# Patient Record
Sex: Female | Born: 1945 | Race: White | Hispanic: No
Health system: Southern US, Community
[De-identification: ages and names within clinical notes are randomized; demographics above are authoritative.]

---

## 2009-07-21 ENCOUNTER — Emergency Department: Payer: Self-pay | Admitting: Emergency Medicine

## 2010-03-27 ENCOUNTER — Ambulatory Visit: Payer: Self-pay | Admitting: Vascular Surgery

## 2011-04-01 ENCOUNTER — Ambulatory Visit: Payer: Self-pay | Admitting: Vascular Surgery

## 2012-07-07 ENCOUNTER — Ambulatory Visit: Payer: Self-pay | Admitting: Oncology

## 2012-07-24 ENCOUNTER — Inpatient Hospital Stay: Payer: Self-pay | Admitting: Internal Medicine

## 2012-07-24 LAB — CBC
MCH: 27.6 pg (ref 26.0–34.0)
Platelet: 306 10*3/uL (ref 150–440)
RBC: 4.56 10*6/uL (ref 3.80–5.20)
RDW: 15.4 % — ABNORMAL HIGH (ref 11.5–14.5)
WBC: 8.2 10*3/uL (ref 3.6–11.0)

## 2012-07-24 LAB — URINALYSIS, COMPLETE
Glucose,UR: NEGATIVE mg/dL (ref 0–75)
Hyaline Cast: 23
Nitrite: NEGATIVE
Protein: 100
Squamous Epithelial: 3
WBC UR: 32 /HPF (ref 0–5)

## 2012-07-24 LAB — CK TOTAL AND CKMB (NOT AT ARMC): CK, Total: 27 U/L (ref 21–215)

## 2012-07-24 LAB — COMPREHENSIVE METABOLIC PANEL
Albumin: 3.1 g/dL — ABNORMAL LOW (ref 3.4–5.0)
Anion Gap: 17 — ABNORMAL HIGH (ref 7–16)
Chloride: 99 mmol/L (ref 98–107)
Creatinine: 1.1 mg/dL (ref 0.60–1.30)
EGFR (African American): 60 — ABNORMAL LOW
SGPT (ALT): 10 U/L — ABNORMAL LOW (ref 12–78)
Sodium: 141 mmol/L (ref 136–145)
Total Protein: 7.8 g/dL (ref 6.4–8.2)

## 2012-07-24 LAB — PROTIME-INR: Prothrombin Time: 14.1 secs (ref 11.5–14.7)

## 2012-07-25 ENCOUNTER — Ambulatory Visit: Payer: Self-pay | Admitting: Oncology

## 2012-07-25 LAB — BASIC METABOLIC PANEL
BUN: 19 mg/dL — ABNORMAL HIGH (ref 7–18)
Chloride: 107 mmol/L (ref 98–107)
Co2: 27 mmol/L (ref 21–32)
EGFR (African American): >60
EGFR (Non-African Amer.): >60
Osmolality: 288 (ref 275–301)
Potassium: 4 mmol/L (ref 3.5–5.1)

## 2012-07-25 LAB — CBC WITH DIFFERENTIAL/PLATELET
Eosinophil #: 0 10*3/uL (ref 0.0–0.7)
HCT: 35.2 % (ref 35.0–47.0)
HGB: 11.3 g/dL — ABNORMAL LOW (ref 12.0–16.0)
Lymphocyte %: 11.6 %
MCH: 27.2 pg (ref 26.0–34.0)
MCHC: 32.2 g/dL (ref 32.0–36.0)
MCV: 85 fL (ref 80–100)
Monocyte #: 0.6 x10 3/mm (ref 0.2–0.9)
Neutrophil %: 76.9 %
Platelet: 234 10*3/uL (ref 150–440)
RDW: 15.2 % — ABNORMAL HIGH (ref 11.5–14.5)
WBC: 6.1 10*3/uL (ref 3.6–11.0)

## 2012-07-26 LAB — BODY FLUID CELL COUNT WITH DIFFERENTIAL
Basophil: 0 %
Eosinophil: 0 %
Lymphocytes: 45 %
Neutrophils: 40 %
Nucleated Cell Count: 2768 /mm3

## 2012-07-26 LAB — CEA: CEA: 0.4 ng/mL (ref 0.0–4.7)

## 2012-07-26 LAB — URINE CULTURE

## 2012-07-26 LAB — APTT: Activated PTT: 28.6 secs (ref 23.6–35.9)

## 2012-07-26 LAB — PROTEIN, BODY FLUID: Protein, Body Fluid: 6.1 g/dL

## 2012-07-26 LAB — GLUCOSE, SEROUS FLUID: Glucose, Body Fluid: 38 mg/dL

## 2012-07-26 LAB — CA 125: CA 125: 1035 U/mL — ABNORMAL HIGH (ref 0.0–34.0)

## 2012-07-26 LAB — AMYLASE, BODY FLUID: Amylase, Body Fluid: 165 U/L

## 2012-07-27 ENCOUNTER — Ambulatory Visit: Payer: Self-pay | Admitting: Oncology

## 2012-08-05 ENCOUNTER — Ambulatory Visit: Payer: Self-pay | Admitting: Gynecologic Oncology

## 2012-08-05 LAB — BASIC METABOLIC PANEL
Anion Gap: 9 (ref 7–16)
BUN: 18 mg/dL (ref 7–18)
Calcium, Total: 9 mg/dL (ref 8.5–10.1)
Chloride: 98 mmol/L (ref 98–107)
Co2: 27 mmol/L (ref 21–32)
EGFR (Non-African Amer.): 48 — ABNORMAL LOW
Osmolality: 270 (ref 275–301)
Sodium: 134 mmol/L — ABNORMAL LOW (ref 136–145)

## 2012-08-05 LAB — PROTIME-INR: Prothrombin Time: 12.9 secs (ref 11.5–14.7)

## 2012-08-05 LAB — APTT: Activated PTT: 32 secs (ref 23.6–35.9)

## 2012-08-05 LAB — CBC: WBC: 8.3 10*3/uL (ref 3.6–11.0)

## 2012-08-07 ENCOUNTER — Ambulatory Visit: Payer: Self-pay | Admitting: Oncology

## 2012-08-10 ENCOUNTER — Inpatient Hospital Stay: Payer: Self-pay | Admitting: Oncology

## 2012-08-11 LAB — CBC WITH DIFFERENTIAL/PLATELET
Basophil #: 0 10*3/uL (ref 0.0–0.1)
Basophil %: 0.7 %
Eosinophil %: 0.8 %
HCT: 25.6 % — ABNORMAL LOW (ref 35.0–47.0)
HGB: 8.4 g/dL — ABNORMAL LOW (ref 12.0–16.0)
Lymphocyte #: 0.7 10*3/uL — ABNORMAL LOW (ref 1.0–3.6)
Lymphocyte %: 11.4 %
MCHC: 32.9 g/dL (ref 32.0–36.0)
MCV: 85 fL (ref 80–100)
Monocyte #: 0.5 x10 3/mm (ref 0.2–0.9)
Monocyte %: 7.7 %
Neutrophil #: 5.1 10*3/uL (ref 1.4–6.5)
Neutrophil %: 79.4 %
RBC: 3.02 10*6/uL — ABNORMAL LOW (ref 3.80–5.20)
WBC: 6.4 10*3/uL (ref 3.6–11.0)

## 2012-08-11 LAB — BASIC METABOLIC PANEL
Calcium, Total: 8.2 mg/dL — ABNORMAL LOW (ref 8.5–10.1)
Chloride: 103 mmol/L (ref 98–107)
Co2: 29 mmol/L (ref 21–32)
Creatinine: 0.71 mg/dL (ref 0.60–1.30)
EGFR (African American): >60
EGFR (Non-African Amer.): >60
Osmolality: 275 (ref 275–301)
Potassium: 3.4 mmol/L — ABNORMAL LOW (ref 3.5–5.1)

## 2012-08-25 LAB — CBC CANCER CENTER
Basophil %: 1.2 %
Eosinophil #: 0.1 x10 3/mm (ref 0.0–0.7)
Eosinophil %: 1.6 %
HCT: 30.3 % — ABNORMAL LOW (ref 35.0–47.0)
Lymphocyte #: 1.1 x10 3/mm (ref 1.0–3.6)
Lymphocyte %: 19.4 %
MCH: 26.8 pg (ref 26.0–34.0)
MCHC: 31.7 g/dL — ABNORMAL LOW (ref 32.0–36.0)
Monocyte %: 9 %
Neutrophil %: 68.8 %
Platelet: 463 x10 3/mm — ABNORMAL HIGH (ref 150–440)
RBC: 3.58 10*6/uL — ABNORMAL LOW (ref 3.80–5.20)
RDW: 16.5 % — ABNORMAL HIGH (ref 11.5–14.5)
WBC: 5.7 x10 3/mm (ref 3.6–11.0)

## 2012-08-25 LAB — COMPREHENSIVE METABOLIC PANEL
Albumin: 1.8 g/dL — ABNORMAL LOW (ref 3.4–5.0)
Alkaline Phosphatase: 148 U/L — ABNORMAL HIGH (ref 50–136)
Anion Gap: 10 (ref 7–16)
Bilirubin,Total: 0.4 mg/dL (ref 0.2–1.0)
Creatinine: 1.17 mg/dL (ref 0.60–1.30)
EGFR (Non-African Amer.): 48 — ABNORMAL LOW
Glucose: 109 mg/dL — ABNORMAL HIGH (ref 65–99)
Osmolality: 282 (ref 275–301)
SGOT(AST): 28 U/L (ref 15–37)
Sodium: 141 mmol/L (ref 136–145)
Total Protein: 5.3 g/dL — ABNORMAL LOW (ref 6.4–8.2)

## 2012-08-25 LAB — PROTIME-INR
INR: 2.2
Prothrombin Time: 24.9 secs — ABNORMAL HIGH (ref 11.5–14.7)

## 2012-09-01 LAB — PROTIME-INR
INR: 2.1
Prothrombin Time: 23.4 secs — ABNORMAL HIGH (ref 11.5–14.7)

## 2012-09-01 LAB — COMPREHENSIVE METABOLIC PANEL
Albumin: 2.1 g/dL — ABNORMAL LOW (ref 3.4–5.0)
Anion Gap: 7 (ref 7–16)
Bilirubin,Total: 0.7 mg/dL (ref 0.2–1.0)
Calcium, Total: 8.2 mg/dL — ABNORMAL LOW (ref 8.5–10.1)
Chloride: 100 mmol/L (ref 98–107)
Co2: 32 mmol/L (ref 21–32)
Creatinine: 0.96 mg/dL (ref 0.60–1.30)
EGFR (African American): >60
EGFR (Non-African Amer.): >60
Osmolality: 278 (ref 275–301)
SGPT (ALT): 18 U/L (ref 12–78)
Sodium: 139 mmol/L (ref 136–145)
Total Protein: 5.8 g/dL — ABNORMAL LOW (ref 6.4–8.2)

## 2012-09-01 LAB — CBC CANCER CENTER
Basophil %: 1.6 %
Eosinophil %: 1.1 %
HCT: 28.8 % — ABNORMAL LOW (ref 35.0–47.0)
HGB: 9.3 g/dL — ABNORMAL LOW (ref 12.0–16.0)
Lymphocyte #: 0.4 x10 3/mm — ABNORMAL LOW (ref 1.0–3.6)
Lymphocyte %: 25.4 %
MCH: 27 pg (ref 26.0–34.0)
MCHC: 32.3 g/dL (ref 32.0–36.0)
Monocyte #: 0.1 x10 3/mm — ABNORMAL LOW (ref 0.2–0.9)
Monocyte %: 6.3 %
Neutrophil #: 1 x10 3/mm — ABNORMAL LOW (ref 1.4–6.5)
Neutrophil %: 65.6 %
Platelet: 357 x10 3/mm (ref 150–440)
RBC: 3.44 10*6/uL — ABNORMAL LOW (ref 3.80–5.20)
RDW: 16.3 % — ABNORMAL HIGH (ref 11.5–14.5)
WBC: 1.5 x10 3/mm — CL (ref 3.6–11.0)

## 2012-09-04 ENCOUNTER — Ambulatory Visit: Payer: Self-pay | Admitting: Oncology

## 2012-09-08 LAB — CBC CANCER CENTER
Eosinophil %: 0.4 %
HCT: 31.2 % — ABNORMAL LOW (ref 35.0–47.0)
Lymphocyte #: 0.9 x10 3/mm — ABNORMAL LOW (ref 1.0–3.6)
Lymphocyte %: 19.2 %
MCH: 27.5 pg (ref 26.0–34.0)
MCHC: 32.9 g/dL (ref 32.0–36.0)
MCV: 84 fL (ref 80–100)
Monocyte #: 0.4 x10 3/mm (ref 0.2–0.9)
Neutrophil #: 3.5 x10 3/mm (ref 1.4–6.5)
Neutrophil %: 71.3 %
RDW: 17.1 % — ABNORMAL HIGH (ref 11.5–14.5)
WBC: 4.9 x10 3/mm (ref 3.6–11.0)

## 2012-09-08 LAB — BASIC METABOLIC PANEL
Calcium, Total: 8.4 mg/dL — ABNORMAL LOW (ref 8.5–10.1)
Co2: 29 mmol/L (ref 21–32)
Creatinine: 1 mg/dL (ref 0.60–1.30)
Glucose: 76 mg/dL (ref 65–99)
Potassium: 3.4 mmol/L — ABNORMAL LOW (ref 3.5–5.1)
Sodium: 138 mmol/L (ref 136–145)

## 2012-09-08 LAB — HEPATIC FUNCTION PANEL A (ARMC)
Albumin: 2.1 g/dL — ABNORMAL LOW (ref 3.4–5.0)
Bilirubin, Direct: 0.4 mg/dL — ABNORMAL HIGH (ref 0.00–0.20)
Bilirubin,Total: 0.5 mg/dL (ref 0.2–1.0)
SGOT(AST): 41 U/L — ABNORMAL HIGH (ref 15–37)
SGPT (ALT): 22 U/L (ref 12–78)
Total Protein: 6 g/dL — ABNORMAL LOW (ref 6.4–8.2)

## 2012-09-08 LAB — PROTIME-INR: INR: 3.4

## 2012-09-15 LAB — COMPREHENSIVE METABOLIC PANEL
Albumin: 1.8 g/dL — ABNORMAL LOW (ref 3.4–5.0)
Alkaline Phosphatase: 131 U/L (ref 50–136)
Bilirubin,Total: 0.6 mg/dL (ref 0.2–1.0)
Calcium, Total: 7.7 mg/dL — ABNORMAL LOW (ref 8.5–10.1)
Chloride: 101 mmol/L (ref 98–107)
EGFR (African American): 58 — ABNORMAL LOW
Glucose: 125 mg/dL — ABNORMAL HIGH (ref 65–99)
Potassium: 2.7 mmol/L — ABNORMAL LOW (ref 3.5–5.1)
Total Protein: 5.3 g/dL — ABNORMAL LOW (ref 6.4–8.2)

## 2012-09-15 LAB — CBC CANCER CENTER
HCT: 27 % — ABNORMAL LOW (ref 35.0–47.0)
Lymphocyte #: 0.9 x10 3/mm — ABNORMAL LOW (ref 1.0–3.6)
MCHC: 33.2 g/dL (ref 32.0–36.0)
MCV: 84 fL (ref 80–100)
Monocyte #: 0.5 x10 3/mm (ref 0.2–0.9)
Monocyte %: 8 %
Neutrophil %: 74.6 %
Platelet: 400 x10 3/mm (ref 150–440)
WBC: 6 x10 3/mm (ref 3.6–11.0)

## 2012-09-15 LAB — PROTIME-INR: Prothrombin Time: 27.9 secs — ABNORMAL HIGH (ref 11.5–14.7)

## 2012-09-22 LAB — CBC CANCER CENTER
Basophil #: 0 x10 3/mm (ref 0.0–0.1)
Eosinophil %: 3 %
HCT: 22.9 % — ABNORMAL LOW (ref 35.0–47.0)
HGB: 7.5 g/dL — ABNORMAL LOW (ref 12.0–16.0)
Lymphocyte #: 0.7 x10 3/mm — ABNORMAL LOW (ref 1.0–3.6)
Lymphocyte %: 43.2 %
MCH: 27.7 pg (ref 26.0–34.0)
MCHC: 32.8 g/dL (ref 32.0–36.0)
MCV: 84 fL (ref 80–100)
Monocyte #: 0.1 x10 3/mm — ABNORMAL LOW (ref 0.2–0.9)
Monocyte %: 4.1 %
Neutrophil #: 0.8 x10 3/mm — ABNORMAL LOW (ref 1.4–6.5)
Neutrophil %: 49.1 %
Platelet: 261 x10 3/mm (ref 150–440)
RBC: 2.71 10*6/uL — ABNORMAL LOW (ref 3.80–5.20)
RDW: 19.7 % — ABNORMAL HIGH (ref 11.5–14.5)
WBC: 1.6 x10 3/mm — CL (ref 3.6–11.0)

## 2012-09-22 LAB — PROTIME-INR
INR: 3.7
Prothrombin Time: 35.3 secs — ABNORMAL HIGH (ref 11.5–14.7)

## 2012-09-29 LAB — PROTIME-INR: Prothrombin Time: 43.1 secs — ABNORMAL HIGH (ref 11.5–14.7)

## 2012-10-05 ENCOUNTER — Ambulatory Visit: Payer: Self-pay | Admitting: Oncology

## 2012-10-06 LAB — CBC CANCER CENTER
Basophil #: 0.1 x10 3/mm (ref 0.0–0.1)
Basophil %: 1 %
Eosinophil #: 0 x10 3/mm (ref 0.0–0.7)
Eosinophil %: 0.6 %
HCT: 25.7 % — ABNORMAL LOW (ref 35.0–47.0)
HGB: 8.3 g/dL — ABNORMAL LOW (ref 12.0–16.0)
MCH: 28.2 pg (ref 26.0–34.0)
MCHC: 32.3 g/dL (ref 32.0–36.0)
MCV: 87 fL (ref 80–100)
Monocyte %: 7 %
RBC: 2.95 10*6/uL — ABNORMAL LOW (ref 3.80–5.20)
RDW: 23.6 % — ABNORMAL HIGH (ref 11.5–14.5)
WBC: 5.7 x10 3/mm (ref 3.6–11.0)

## 2012-10-06 LAB — COMPREHENSIVE METABOLIC PANEL
Albumin: 1.6 g/dL — ABNORMAL LOW (ref 3.4–5.0)
Alkaline Phosphatase: 211 U/L — ABNORMAL HIGH (ref 50–136)
BUN: 11 mg/dL (ref 7–18)
Calcium, Total: 7.5 mg/dL — ABNORMAL LOW (ref 8.5–10.1)
Co2: 27 mmol/L (ref 21–32)
Creatinine: 1.21 mg/dL (ref 0.60–1.30)
EGFR (African American): 53 — ABNORMAL LOW
Osmolality: 277 (ref 275–301)
SGOT(AST): 38 U/L — ABNORMAL HIGH (ref 15–37)
Total Protein: 5.1 g/dL — ABNORMAL LOW (ref 6.4–8.2)

## 2012-10-06 LAB — PROTIME-INR: INR: 3.5

## 2012-10-07 LAB — CA 125: CA 125: 1546 U/mL — ABNORMAL HIGH (ref 0.0–34.0)

## 2012-10-11 LAB — CBC CANCER CENTER
Bands: 6 %
Eosinophil: 3 %
HCT: 28.9 % — ABNORMAL LOW (ref 35.0–47.0)
HGB: 9.5 g/dL — ABNORMAL LOW (ref 12.0–16.0)
MCH: 28.1 pg (ref 26.0–34.0)
MCHC: 33 g/dL (ref 32.0–36.0)
MCV: 85 fL (ref 80–100)
Metamyelocyte: 1 %
Monocytes: 5 %
Myelocyte: 1 %
Platelet: 276 x10 3/mm (ref 150–440)
RBC: 3.39 10*6/uL — ABNORMAL LOW (ref 3.80–5.20)
RDW: 23.5 % — ABNORMAL HIGH (ref 11.5–14.5)
Segmented Neutrophils: 38 %
WBC: 0.9 x10 3/mm — CL (ref 3.6–11.0)

## 2012-10-11 LAB — PROTIME-INR
INR: 2.4
Prothrombin Time: 25.5 secs — ABNORMAL HIGH (ref 11.5–14.7)

## 2012-10-12 ENCOUNTER — Inpatient Hospital Stay: Payer: Self-pay | Admitting: Internal Medicine

## 2012-10-12 LAB — CBC WITH DIFFERENTIAL/PLATELET
HCT: 30.8 % — ABNORMAL LOW (ref 35.0–47.0)
HGB: 9.9 g/dL — ABNORMAL LOW (ref 12.0–16.0)
Lymphocytes: 68 %
MCV: 88 fL (ref 80–100)
Monocytes: 4 %
Platelet: 216 10*3/uL (ref 150–440)
RDW: 23.4 % — ABNORMAL HIGH (ref 11.5–14.5)
Segmented Neutrophils: 28 %
WBC: 0.5 10*3/uL — CL (ref 3.6–11.0)

## 2012-10-12 LAB — CK: CK, Total: 32 U/L (ref 21–215)

## 2012-10-12 LAB — COMPREHENSIVE METABOLIC PANEL
Alkaline Phosphatase: 157 U/L — ABNORMAL HIGH (ref 50–136)
Anion Gap: 8 (ref 7–16)
Calcium, Total: 7.3 mg/dL — ABNORMAL LOW (ref 8.5–10.1)
Chloride: 104 mmol/L (ref 98–107)
Creatinine: 0.77 mg/dL (ref 0.60–1.30)
EGFR (Non-African Amer.): >60
Glucose: 84 mg/dL (ref 65–99)
Potassium: 4 mmol/L (ref 3.5–5.1)
SGOT(AST): 28 U/L (ref 15–37)
Total Protein: 5 g/dL — ABNORMAL LOW (ref 6.4–8.2)

## 2012-10-12 LAB — PROTIME-INR
INR: 2.3
Prothrombin Time: 24.5 secs — ABNORMAL HIGH (ref 11.5–14.7)

## 2012-10-12 LAB — URINALYSIS, COMPLETE
Bacteria: NONE SEEN
Bilirubin,UR: NEGATIVE
Blood: NEGATIVE
Glucose,UR: NEGATIVE mg/dL (ref 0–75)
Hyaline Cast: 4
Leukocyte Esterase: NEGATIVE
Nitrite: NEGATIVE
Specific Gravity: 1.027 (ref 1.003–1.030)
WBC UR: 2 /HPF (ref 0–5)

## 2012-10-12 LAB — TROPONIN I: Troponin-I: <0.02 ng/mL

## 2012-10-12 LAB — AMMONIA: Ammonia, Plasma: <25 mcmol/L (ref 11–32)

## 2012-10-13 LAB — CBC WITH DIFFERENTIAL/PLATELET
Basophil #: 0 10*3/uL (ref 0.0–0.1)
Basophil #: 0 10*3/uL (ref 0.0–0.1)
Eosinophil #: 0 10*3/uL (ref 0.0–0.7)
Eosinophil #: 0 10*3/uL (ref 0.0–0.7)
HCT: 22.4 % — ABNORMAL LOW (ref 35.0–47.0)
HGB: 7.6 g/dL — ABNORMAL LOW (ref 12.0–16.0)
HGB: 7.3 g/dL — ABNORMAL LOW (ref 12.0–16.0)
Lymphocyte #: 0.2 10*3/uL — ABNORMAL LOW (ref 1.0–3.6)
Lymphocyte #: 0.3 10*3/uL — ABNORMAL LOW (ref 1.0–3.6)
Lymphocyte %: 51.5 %
Lymphocyte %: 55.9 %
MCH: 28.6 pg (ref 26.0–34.0)
MCHC: 32.8 g/dL (ref 32.0–36.0)
MCV: 86 fL (ref 80–100)
Monocyte #: 0 x10 3/mm — ABNORMAL LOW (ref 0.2–0.9)
Monocyte %: 29.7 %
Neutrophil #: 0.1 10*3/uL — ABNORMAL LOW (ref 1.4–6.5)
Neutrophil %: 12.8 %
Platelet: 184 10*3/uL (ref 150–440)
RBC: 2.66 10*6/uL — ABNORMAL LOW (ref 3.80–5.20)
RDW: 22.6 % — ABNORMAL HIGH (ref 11.5–14.5)
WBC: 0.3 10*3/uL — CL (ref 3.6–11.0)
WBC: 0.5 10*3/uL — CL (ref 3.6–11.0)

## 2012-10-13 LAB — BASIC METABOLIC PANEL
Anion Gap: 9 (ref 7–16)
BUN: 13 mg/dL (ref 7–18)
Co2: 26 mmol/L (ref 21–32)
Creatinine: 0.64 mg/dL (ref 0.60–1.30)
EGFR (African American): >60
Sodium: 140 mmol/L (ref 136–145)

## 2012-10-14 LAB — CBC WITH DIFFERENTIAL/PLATELET
Basophil #: 0 10*3/uL (ref 0.0–0.1)
Eosinophil #: 0 10*3/uL (ref 0.0–0.7)
Eosinophil %: 3.6 %
HGB: 7.3 g/dL — ABNORMAL LOW (ref 12.0–16.0)
Lymphocyte #: 0.3 10*3/uL — ABNORMAL LOW (ref 1.0–3.6)
MCH: 28.6 pg (ref 26.0–34.0)
MCV: 87 fL (ref 80–100)
Monocyte %: 26.7 %
Neutrophil #: 0.1 10*3/uL — ABNORMAL LOW (ref 1.4–6.5)
Neutrophil %: 13.5 %
RBC: 2.54 10*6/uL — ABNORMAL LOW (ref 3.80–5.20)

## 2012-10-15 LAB — CBC WITH DIFFERENTIAL/PLATELET
Basophil #: 0 10*3/uL (ref 0.0–0.1)
Basophil %: 0.2 %
Eosinophil #: 0 10*3/uL (ref 0.0–0.7)
Eosinophil %: 1.3 %
HCT: 21.5 % — ABNORMAL LOW (ref 35.0–47.0)
HGB: 7.1 g/dL — ABNORMAL LOW (ref 12.0–16.0)
Lymphocyte #: 0.3 10*3/uL — ABNORMAL LOW (ref 1.0–3.6)
MCH: 28.3 pg (ref 26.0–34.0)
MCV: 85 fL (ref 80–100)
Monocyte %: 33 %
Neutrophil #: 0.4 10*3/uL — ABNORMAL LOW (ref 1.4–6.5)
Neutrophil %: 37.9 %
Platelet: 175 10*3/uL (ref 150–440)
RBC: 2.52 10*6/uL — ABNORMAL LOW (ref 3.80–5.20)
RDW: 22.4 % — ABNORMAL HIGH (ref 11.5–14.5)
WBC: 1.1 10*3/uL — CL (ref 3.6–11.0)

## 2012-10-15 LAB — BASIC METABOLIC PANEL
Anion Gap: 8 (ref 7–16)
BUN: 14 mg/dL (ref 7–18)
Calcium, Total: 6.6 mg/dL — CL (ref 8.5–10.1)
Co2: 25 mmol/L (ref 21–32)
Creatinine: 0.73 mg/dL (ref 0.60–1.30)
EGFR (African American): >60
EGFR (Non-African Amer.): >60
Glucose: 76 mg/dL (ref 65–99)
Osmolality: 277 (ref 275–301)
Potassium: 2.5 mmol/L — CL (ref 3.5–5.1)
Sodium: 139 mmol/L (ref 136–145)

## 2012-10-15 LAB — PROTIME-INR
INR: 3.3
Prothrombin Time: 32.2 secs — ABNORMAL HIGH (ref 11.5–14.7)

## 2012-10-16 LAB — CBC WITH DIFFERENTIAL/PLATELET
Bands: 22 %
Eosinophil: 3 %
HCT: 22.9 % — ABNORMAL LOW (ref 35.0–47.0)
Lymphocytes: 22 %
MCH: 28.2 pg (ref 26.0–34.0)
MCV: 87 fL (ref 80–100)
Monocytes: 2 %
Myelocyte: 1 %
NRBC/100 WBC: 4 /
Platelet: 178 10*3/uL (ref 150–440)
RDW: 22.8 % — ABNORMAL HIGH (ref 11.5–14.5)
WBC: 2.7 10*3/uL — ABNORMAL LOW (ref 3.6–11.0)

## 2012-10-16 LAB — PROTIME-INR
INR: 3.5
Prothrombin Time: 33.5 secs — ABNORMAL HIGH (ref 11.5–14.7)

## 2012-10-16 LAB — POTASSIUM: Potassium: 3.2 mmol/L — ABNORMAL LOW (ref 3.5–5.1)

## 2012-10-17 LAB — CBC WITH DIFFERENTIAL/PLATELET
Basophil #: 0 10*3/uL (ref 0.0–0.1)
Eosinophil %: 0.8 %
Lymphocyte %: 6.9 %
MCH: 28.4 pg (ref 26.0–34.0)
MCHC: 32.4 g/dL (ref 32.0–36.0)
MCV: 88 fL (ref 80–100)
Monocyte #: 0.4 x10 3/mm (ref 0.2–0.9)
Monocyte %: 6 %
Neutrophil %: 86.2 %
Platelet: 178 10*3/uL (ref 150–440)
RDW: 23.3 % — ABNORMAL HIGH (ref 11.5–14.5)

## 2012-10-19 LAB — CULTURE, BLOOD (SINGLE)

## 2012-11-04 ENCOUNTER — Ambulatory Visit: Payer: Self-pay | Admitting: Oncology

## 2012-11-04 DEATH — deceased

## 2013-12-15 IMAGING — CT CT HEAD WITHOUT CONTRAST
1 of 2 series · 13 of 30 positions shown, 17 images · non-contrast
Comparison: none

REASON FOR EXAM: confusion rising CA 125
COMMENTS:

PROCEDURE:     ACEVEDO ZAPATA - SARADA TART WITHOUT CONTRAST  - October 11, 2012 [DATE]
RESULT:     History: Confusion. Elevated tumor markers.
Comparison Study: No recent.

[Series 2: soft tissue · axial · 0.39mm/px · z∈[+3,+123]mm · 13 of 30 slices shown, 17 images]
[im 3/30  brain]
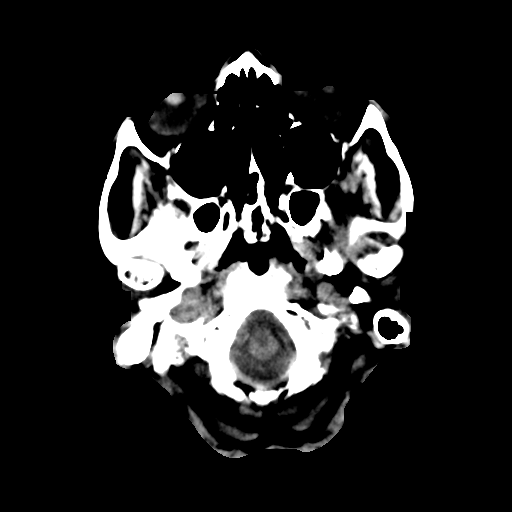
[im 3/30  bone]
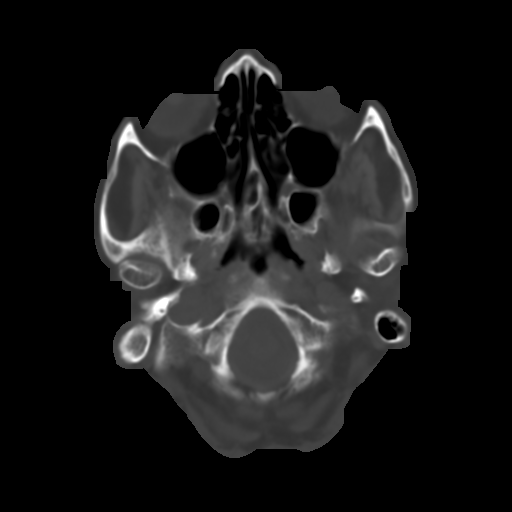
[im 5/30  brain]
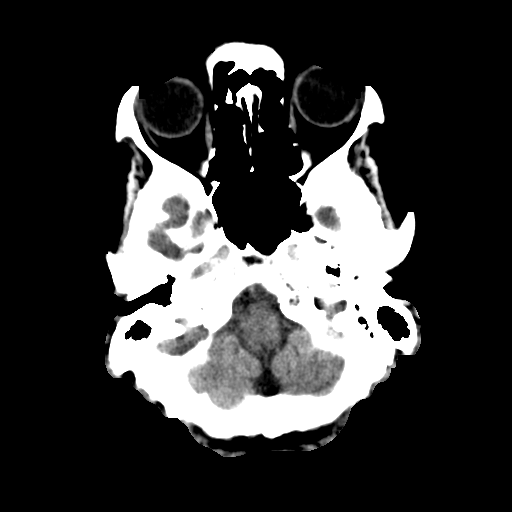
[im 7/30  brain]
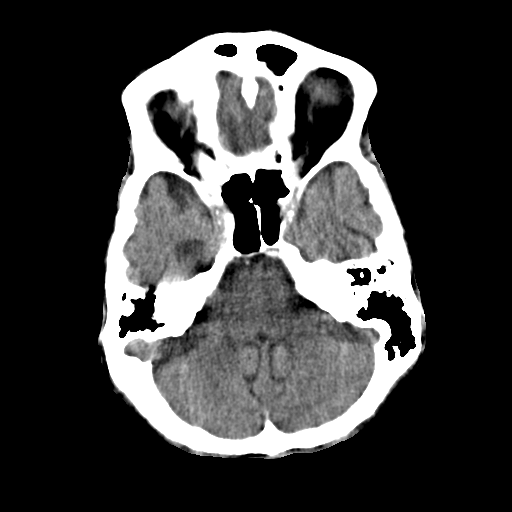
[im 9/30  brain]
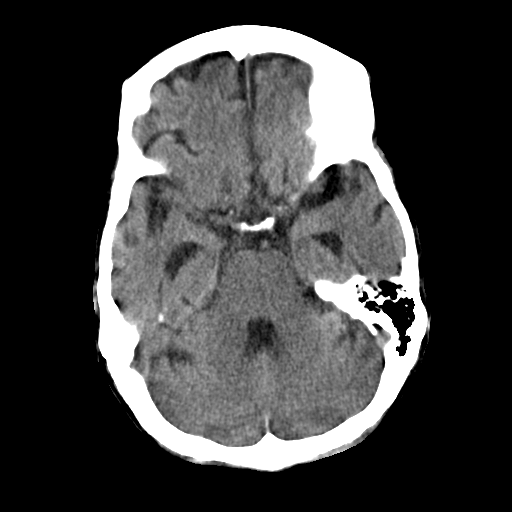
[im 11/30  brain]
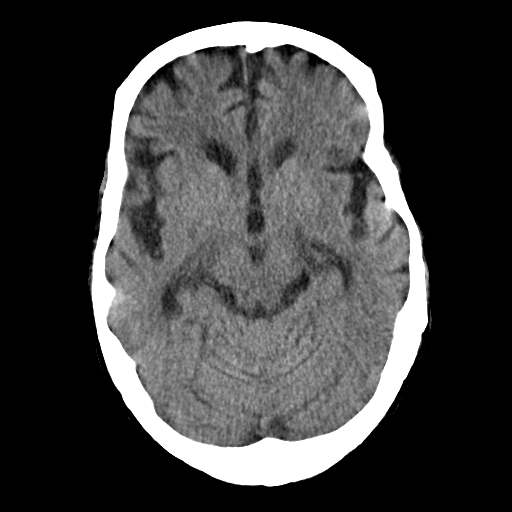
[im 11/30  bone]
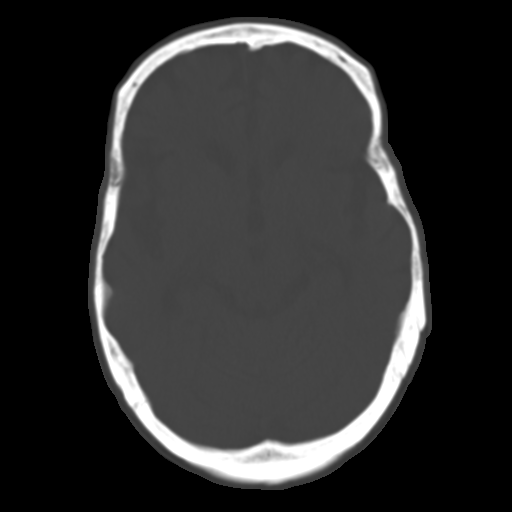
[im 13/30  brain]
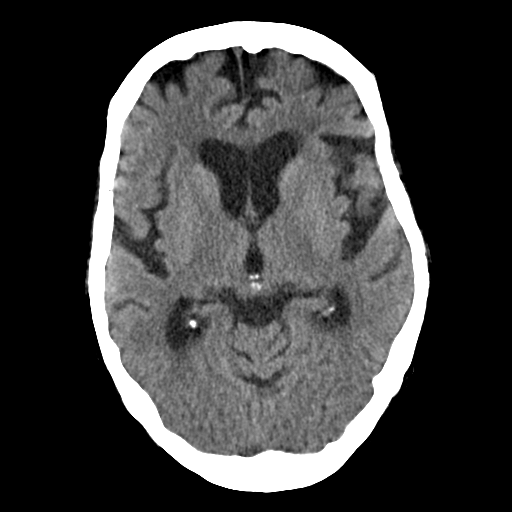
[im 15/30  brain]
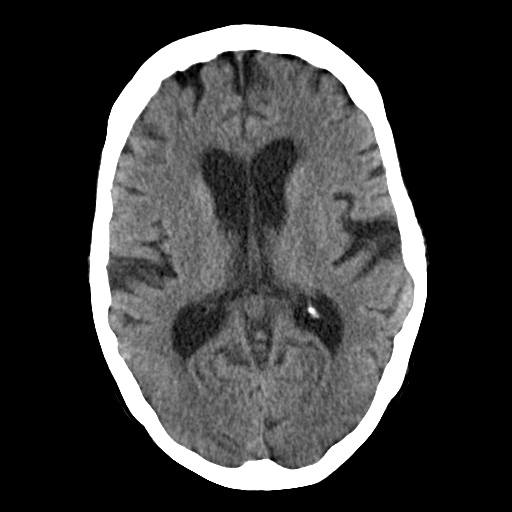
[im 17/30  brain]
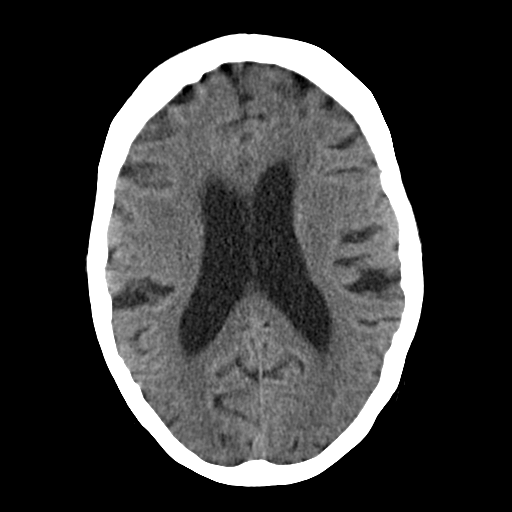
[im 19/30  brain]
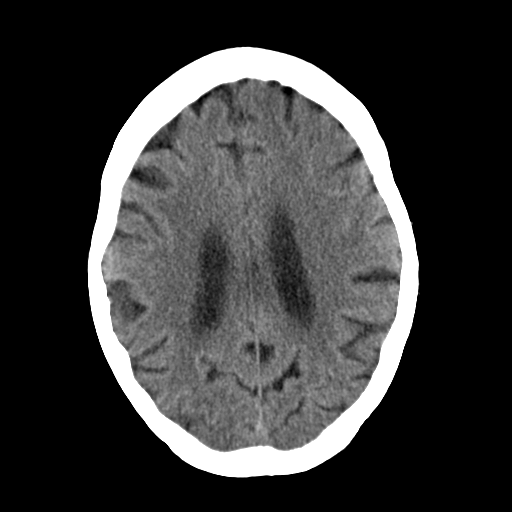
[im 19/30  bone]
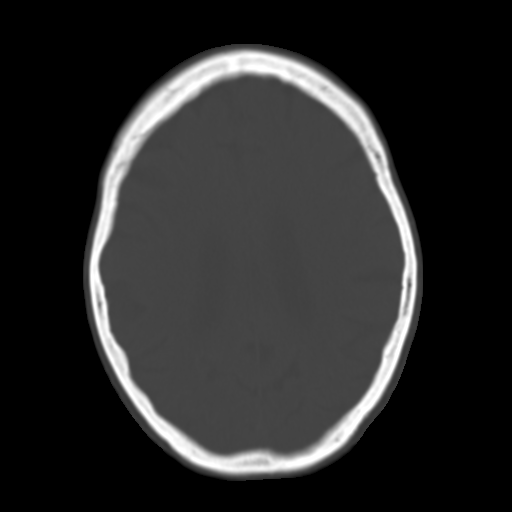
[im 21/30  brain]
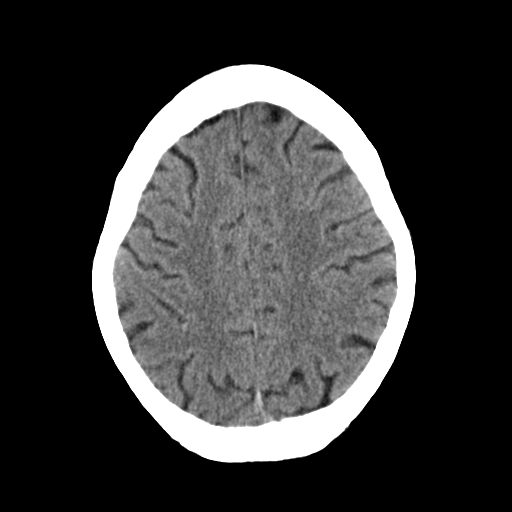
[im 23/30  brain]
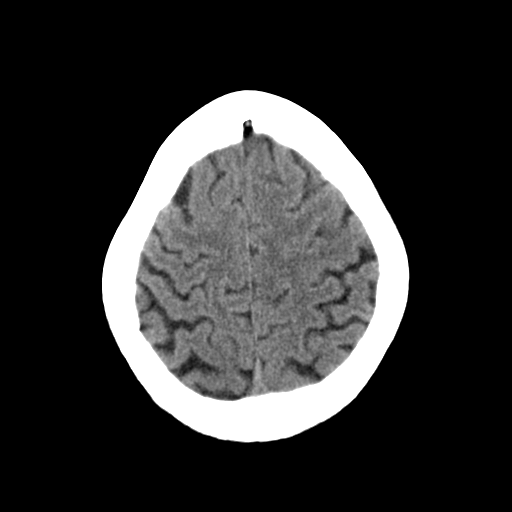
[im 25/30  brain]
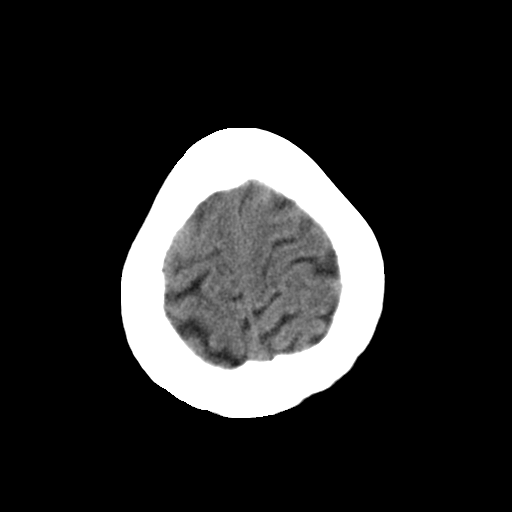
[im 27/30  brain]
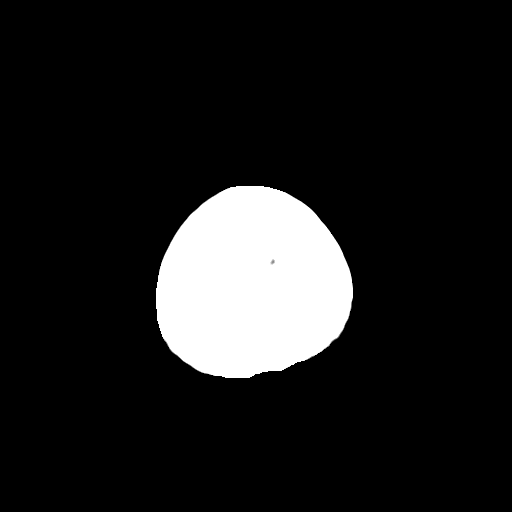
[im 27/30  bone]
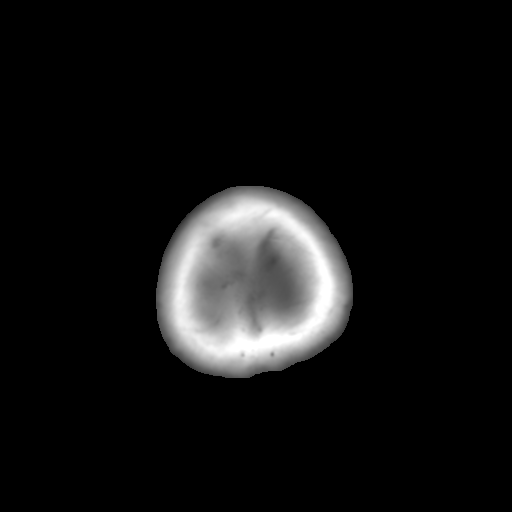

[13 of 30 positions shown; findings below may reference images not displayed]

FINDINGS: Standard nonenhanced CT obtained. No mass. No hydrocephalus. No
hemorrhage. No acute bony abnormality.
IMPRESSION: No acute abnormality.

## 2014-10-27 NOTE — Discharge Summary (Signed)
PATIENT NAME:  Tami [name], Tami [name] MR#:  [id number] DATE OF BIRTH:  1945/08/17  DATE OF ADMISSION:  07/26/2012 DATE OF DISCHARGE:    PRESENTING COMPLAINT: Abdominal distention and abdominal pain.   DISCHARGE DIAGNOSES: 1.  Peritoneal carcinomatosis, source unknown.  2.  Ascites, status post paracentesis with cytology pending.  3.  Elevate CA 125.  The patient to follow-up with Dr. Wendy Poet.  4.  Hypertension.  5.  Nausea and weight loss, suspected due to peritoneal carcinomatosis.  6.  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 7.  Urinary tract infection.   CODE STATUS: Full code.   DISCHARGE MEDICATIONS: 1.  Atorvastatin 20 mg p.o. at bedtime. 2.  Clonazepam 0.5 mg 3 times a day. 3.  Fluoxetine 20 mg p.o. daily.  4.  Meloxicam 15 mg p.o. daily.  5.  Amlodipine 2.5 mg daily.  6.  Ensure Plus one can daily.  7.  Cipro 250 b.i.d.  8.  Promethazine 25 mg 1 tablet daily every 6 hours as needed.   DIET: Regular.   FOLLOWUP:  1.  Follow up with Dr. Orlie Dakin on January 21st at 3:30 p.m.  2.  Follow up with [facility] in 1 to 2 weeks.  PROCEDURES: Ultrasound-guided paracentesis done on January 20th by Dr. Register, interventional radiology, with removal of 1000 mL of ascitic fluid.  Ascitic fluid protein is 6.1, albumin 3.3, glucose 38, amylase is 165, nucleated cell count 2768. Neutrophils 40%, lymphocytes 45%. Cytology pending. White count 6.1, H and H  11.3 and 35.2. Glucose 77.  The rest of the chemistries are within normal limits. PT-INR 14.1 and 1.1. CA 125 is 1035. CEA is 0.4. CT of the abdomen and pelvis with contrast shows moderate degree of infected. There is nodular thickening of the peritoneal reflexion as well as nodular thickening of the mesenteric fat of the upper abdomen which is concerning for peritoneal carcinomatosis. Descending thoracic aortic aneurysm as well as thoracic and abdominal aortic dissection was similar to prior CT compared with  September 2012. Aneurysmal dilatation of the partially visualized portion of the ascending aorta is similar to prior CT.  UA positive for urinary tract infection. Urine culture negative. Cardiac enzymes negative.   CONSULTATIONS:   1.  Dr. Wyn Quaker.   [facility].  [facility], Dr. Orlie Dakin.   BRIEF SUMMARY OF HOSPITAL COURSE:  The patient is a 69 year old Caucasian female who comes to the Emergency Room with:  1.  Abdominal pain, nausea and vomiting. She had presented with abdominal pain, nausea, vomiting, The patient's CT scan showed peritoneal carcinomatosis, source unclear at this time. Could be ovarian, given elevated CA 125. The patient, however, reports undergoing total abdominal hysterectomy; details not available. She was seen by Dr. Orlie Dakin who recommends the patient be seen by Dr. Wendy Poet at the [facility]. The patient has appointment for [facility] at 3:30 p.m.  The patient's granddaughter was explained about the above. 2.  Nausea.  Supportive treatment was given. She was encouraged on Ensure drinks and p.r.n. Phenergan was prescribed.  3.  Malnutrition with weight loss, suspected from possible underlying malignancy.  4.  Aortic dissection, chronic. The patient was seen by Dr. Wyn Quaker.  All home blood pressure medications were continued. Blood pressure medications were continued. The patient is aware of her CT findings.  She may need repair if the aneurysm continues to enlarge.  5.  Hyperlipidemia on Lipitor.   Overall hospital stay remained stable.   CODE STATUS: The  patient remained a full code.   TIME SPENT: 40 minutes.    ____________________________ Wylie HailSona A. Allena KatzPatel, MD sap:ct D: 07/27/2012 08:13:15 ET T: 07/27/2012 10:01:59 ET JOB#: [id number]  cc: Cali Cuartas A. Allena KatzPatel, MD, <Dictator> Tollie Pizzaimothy J. Orlie DakinFinnegan, MD Maxine GlennBrigitte E. Miller, MD [facility] Annice NeedyJason S. Dew, MD   Willow OraSONA A Zan Orlick MD ELECTRONICALLY SIGNED 07/30/2012 21:34

## 2014-10-27 NOTE — H&P (Signed)
PATIENT NAME:  Tami [name], Tami [name] MR#:  [id number] DATE OF BIRTH:  1946/06/14  DATE OF ADMISSION:  07/24/2012  PRIMARY CARE PHYSICIAN: Patient does not have one.   CHIEF COMPLAINT: Abdominal pain, nausea and vomiting.   HISTORY OF PRESENT ILLNESS: This is [name] who presents to the hospital with abdominal pain, nausea, vomiting ongoing now for the past month and [name] half. The patient says that she does not have primary care physician and did not see [name] doctor despite having these symptoms. The patient says she is able to swallow okay, but had developed significant nausea shortly after she eats.  She has had poor p.o. intake now for the past month and [name] half. She has also had significant weight loss, although she cannot tell me how many pounds she has lost.  As per the son at bedside, she has lost [name] significant amount of weight. The patient did not want to come to the hospital but her son convinced her and brought her to the Emergency Room. The patient underwent [name] CT scan of the abdomen and pelvis here in the Emergency Room which showed signs of ascites and findings consistent with peritoneal carcinomatosis. Hospitalist services were contacted for further treatment and evaluation.   REVIEW OF SYSTEMS:  CONSTITUTIONAL: No documented fever. Positive fatigue and weakness. Positive weight loss, although cannot tell me how much. No weight gain.  EYES: No blurry or double vision.  ENT: No tinnitus. No postnasal drip. No redness of the oropharynx.  RESPIRATORY: No cough, no wheeze, no hemoptysis, no dyspnea.  CARDIOVASCULAR: No chest pain, no orthopnea, no palpitations or syncope.  GASTROINTESTINAL: Positive nausea. Positive vomiting. Positive generalized abdominal pain. No melena, no hematochezia.  GENITOURINARY: No dysuria or hematuria.  ENDOCRINE: No polyuria or nocturia. No heat or cold intolerance.   HEMATOLOGIC:  No anemia, no bruising, no bleeding.  INTEGUMENTARY: No rashes or lesions.   MUSCULOSKELETAL: No arthritis, no swelling and no gout.  NEUROLOGIC: No numbness or tingling. No ataxia, no seizure activity.  PSYCHIATRIC: No anxiety, no insomnia, no ADD.   PAST MEDICAL HISTORY:  History of hypertension, history of coronary artery disease, history of tobacco abuse.   ALLERGIES: No known drug allergies.   SOCIAL HISTORY: Used to be [name] smoker, quit about 4-5 years ago. DID smoke about 20+ years. No alcohol abuse. No illicit drug abuse. Lives at home by herself.   FAMILY HISTORY: Father died from complications of lung cancer. Mother died from complications of heart disease.   CURRENT MEDICATIONS: She is currently on no medications although she is supposed to be taking medications for hypertension.   PHYSICAL EXAMINATION:  VITAL SIGNS:  Temperature 98.4, pulse 98, respirations 18, blood pressure 107/79, sats 98% on room air.  GENERAL: She is [name] pleasant-appearing female but very unkempt in no apparent distress.  HEENT: Atraumatic, normocephalic. Her extraocular muscles are intact. Pupils equal, round and reactive to light. Sclerae anicteric. No conjunctival injection. No oropharyngeal erythema. Poor dentition.  HEART: Regular rate and rhythm. No murmurs, rubs or clicks. Tachycardic.  LUNGS: Clear to auscultation bilaterally. No rales, no rhonchi, no wheezes. No dullness to percussion.  ABDOMEN: Soft, flat, distended. Hypoactive bowel sounds. No hepatosplenomegaly appreciated.  EXTREMITIES: No evidence of any cyanosis, clubbing or peripheral edema. Has +2 pedal and radial pulses bilaterally.  NEUROLOGIC: She is alert, awake and oriented x 3 with no focal motor or sensory deficits appreciated bilaterally.  SKIN: Moist and warm with no rash appreciated.  LYMPHATIC: There is  no cervical or axillary lymphadenopathy.   LABORATORY AND DIAGNOSTIC DATA:  Serum glucose 108, BUN 22, creatinine 1.1, sodium 141, potassium 3.4, chloride 99, bicarb 25. The patient's LFTs are within normal  limits. Albumin 3.1. Troponin less than 0.02. White cell count 8.2, hemoglobin 12.6, hematocrit 39, platelet count 386.  Urinalysis positive for UTI. CT scan of the abdomen and pelvis done with contrast showed moderate degree of ascites, nodular thickening of the peritoneal reflections as low as nodular thickening of the mesenteric fat of the upper abdomen which is concerning for peritoneal carcinomatosis, descending thoracic aortic aneurysm as well as [name] thoracic abdominal aortic dissection, which is similar to prior.   ASSESSMENT AND PLAN: This is [name] with [name] history of hypertension, coronary disease, history of tobacco abuse who has not seen [name] doctor in many years who presents to the hospital with [name] 58-month history of abdominal pain, nausea, vomiting, poor p.o. intake and weight loss. [name] CT scan of the abdomen and pelvis is suggestive of ascites with peritoneal carcinomatosis.   1.  Ascites with peritoneal carcinomatosis. The exact etiology of this is unclear, suspicious for malignancy, with the primary unknown. For now, I will continue supportive care with intravenous fluids, antiemetics and pain control.  She will likely need [name] diagnostic paracentesis which I have ordered.  I also ordered fluid studies for cytology, cell count, pH, glucose, LDH, albumin. I will also go ahead and check [name] CA125 and [name] CEA and we will also obtain oncology consult.   2.  Abdominal pain, nausea and vomiting. This is likely secondary to the CT scan findings as mentioned above. I will continue supportive care with intravenous fluids, antiemetics, pain control and place her on [name] clear liquid diet for now.  3.  Urinary tract infection.  Will start her on intravenous ciprofloxacin. She is clinically asymptomatic. Follow urine cultures.  4.  Aortic dissection/aneurysm. This was noted on the CT scan abdomen and pelvis as stated. The patient has been seen by Dr. Wyn Quaker from vascular surgery who does not think that there is  an acute intervention needed for this at this time; it is currently stable. The scan need to be repeated in 3 months or so.   CODE STATUS: The patient is [name] full code.   Time spent on admission: 50 minutes.   ____________________________ Rolly Pancake. Cherlynn Kaiser, MD vjs:ct D: 07/24/2012 21:58:19 ET T: 07/25/2012 12:08:37 ET JOB#: [id number]  cc: Rolly Pancake. Cherlynn Kaiser, MD, <Dictator> Houston Siren MD ELECTRONICALLY SIGNED 07/26/2012 14:14

## 2014-10-27 NOTE — Consult Note (Signed)
ONCOLOGY followup note - resting in bed, weak, lethargic. Denies pain. No fever.  No obvious bleeding issues. weak and lethargic, NAD          vitals - 98.3, 113, 18, 138/87, 93% on RA          lungs - b/l good breath sounds, no rhonchi          abd - distended, firm, generalised mild tenderness          extremities - edema + hemoglobin 7.1, WBC 1100, ANC 400, platelets 175, INR 3.3, Cr 0.73, K+ 692.295, Ca 336.446. year old female with known Peritoneal carcinomatosis s/p cycle 3 chemo on 10/06/12 with Taxol/Carboplatin, admitted with febrile neutropenia, AMS. MRI brain negative for metastasis or other acute process. Remains afebrile, ANC is beginning to improve and is upto 400 today.  AMS could be from metabolic encephalopathy, recommend continuing current treatment, Neupogen, correct electrolyte abnormalities and see if her condition improves. Overall prognosis seems poor. She is DNR. Continue to monitor daily CBC. Will continue to follow.   Electronic Signatures: Izola PricePandit, Feleica Fulmore Raj (MD)  (Signed on 11-Apr-14 22:32)  Authored  Last Updated: 11-Apr-14 22:32 by Izola PricePandit, Giovanne Nickolson Raj (MD)

## 2014-10-27 NOTE — Consult Note (Signed)
ONCOLOGY followup note - resting in bed, weak, lethargic and noncommunicative. Pain under fairly good control.   No fever.  No obvious bleeding issues. weak, lethargic, otherwise appears comfortable           vitals - 98.8, 120, 16, 117/72, 90% on RA          lungs - b/l good breath sounds, no rhonchi          abd - distended, firm, generalised mild tenderness          extremities - edema + hemoglobin 7.6, WBC 7400, ANC 6400, platelets 178K. year old female with known Peritoneal carcinomatosis s/p cycle 3 chemo on 10/06/12 with Taxol/Carboplatin, admitted with febrile neutropenia, AMS. MRI brain negative for metastasis or other acute process. Remains afebrile, ANC has normalised, stopped Neupogen yesterday. AMS and general condition is not improving and she is more lethargic. Ongoing declining condition could be from terminal condition with confusion/delirium versus metabolic encephalopathy or paraneoplastic syndrome. Continue MSO4 to 2-4 mg IV q 1-2 hours prn pain. She is DNR. Continue to monitor daily CBC.  Per discussion with patient's brother yesterday, they want to also discuss with Dr. Orlie DakinFinnegan who is her primary oncologist and will likely consider hospice if no further improvement.  Will notify Dr. Orlie DakinFinnegan.  Electronic Signatures: Tami [name], Tami [name] (MD)  (Signed on 13-Apr-14 20:12)  Authored  Last Updated: 13-Apr-14 20:12 by Tami [name], [name] (MD)

## 2014-10-27 NOTE — H&P (Signed)
PATIENT NAME:  Tami Tami [name], Tami Tami [name] MR#:  [id number] DATE OF BIRTH:  Jan 24, 1946  DATE OF ADMISSION:  10/12/2012  PRIMARY CARE PHYSICIAN: Tami Fractionimothy Finnegan, MD   CHIEF COMPLAINT: Brought in by family secondary to altered mental status.   HISTORY OF PRESENT ILLNESS: This is Tami [name] who has had altered mental status for Tami [name] few days now, getting worse every day.  She cannot get up on her own.  They physically have to pick her up. She is not eating much as she is refusing to eat and take her medications. She has been talking out of her head and paranoid.  The patient complains of severe abdominal pain, unable to give much history.   The patient is under the treatment of Dr. Orlie Tami [name] for peritoneal carcinomatosis, stage IIIB. She receives chemotherapy every 3 weeks.  In the ER, her white count was found to be 0.5. Family is unable to take care of her at home any more. Hospitalist services were contacted for further evaluation.   PAST MEDICAL HISTORY:  1. Peritoneal carcinomatosis, stage IIIB, undergoing chemotherapy every 3 weeks.  2. Aneurysm. 3. DVT. 4. Anemia.   PAST SURGICAL HISTORY:  1. Hysterectomy.  2. IVC filter. 3. Port-Tami [name]-Cath placement.   ALLERGIES: No known drug allergies.   MEDICATIONS: As per prescription writer include amlodipine 2.5 mg daily, atorvastatin 20 mg daily, Coumadin on hold from 04/03 to 04/06 but she does take 5 mg,  meloxicam 15 mg daily, Reglan 10 mg 4 times Tami [name] day, Zofran 8 mg 3 times Tami [name] day, potassium chloride 20 mEq twice Tami [name] day.   SOCIAL HISTORY: No smoking. No alcohol. No drug use. Lives with family.  FAMILY HISTORY: Father died of probably lung cancer. Mother died of heart failure and had diabetes.   REVIEW OF SYSTEMS:  CONSTITUTIONAL: Positive for cold feeling. No fever. No chills. Positive for weight loss. Positive for weakness.  EYES: She does wear glasses.  EARS, NOSE, MOUTH AND THROAT: No hearing loss. No sore throat. No difficulty swallowing.   CARDIOVASCULAR: No chest pain. No palpitations.  RESPIRATORY: No shortness of breath. No coughing.  GASTROINTESTINAL: Positive for nausea. Positive for vomiting.  Positive for abdominal pain. No diarrhea. No constipation. No bright red blood per rectum. No melena.  GENITOURINARY: No burning on urination, no hematuria.  MUSCULOSKELETAL: No joint pain or muscle pain.  INTEGUMENT: No rash or ulcers.  NEUROLOGICAL: No fainting or blackouts.  PSYCHIATRIC: The patient been talking out of her head.  ENDOCRINE: No thyroid problems.  HEMATOLOGIC/LYMPHATIC: History of anemia, also now leukopenia.   PHYSICAL EXAMINATION: VITAL SIGNS: Temperature 97.9, pulse 119, respirations 17, blood pressure 120/67, pulse ox 94% on room air.  GENERAL: No respiratory distress, lying flat in bed.  HEENT: Eyes: Conjunctivae normal. Lids normal. Pupils are equal, round, and reactive to light. Extraocular muscles are intact. No nystagmus. Ears, nose, mouth, and throat: Tympanic membranes no erythema. Nasal mucosa no erythema. Throat no erythema, no exudate seen. Lips and gums no lesions.  NECK: No JVD. No bruits. No lymphadenopathy. No thyromegaly. No thyroid nodules palpated.  RESPIRATORY: Lungs are clear to auscultation. No use of accessory muscles to breathe. No rhonchi, rales or wheeze heard.  CARDIOVASCULAR: S1 and S2, tachycardic. No gallops or rubs heard. Tami [name] 2/6 systolic ejection murmur. Carotid upstroke 2+ bilaterally. No bruits. Dorsalis pedis pulses 2+ bilaterally. No edema of the lower extremity.  ABDOMEN: Soft. Positive tenderness throughout the entire abdomen.  Guarding. Normoactive bowel sounds.   LYMPHATIC: No lymph  nodes in the neck.  MUSCULOSKELETAL: No clubbing, edema or cyanosis.  SKIN: Around the neck, erythematous area which is blanching.  NEUROLOGIC: Cranial nerves II through XII grossly intact. The patient does answer some yes and no questions appropriately, but the patient is Tami [name] poor historian.  As  per the family, she has been talking out of her head lately.  PSYCHIATRIC: The patient is oriented to person.   LABORATORY AND RADIOLOGICAL DATA:  Glucose 84, BUN 14, creatinine 0.77, sodium 137, potassium 4.0, chloride 104, CO2 25, calcium 7.3, alkaline phosphatase 157, ALT 14, AST 28, total protein 5.0. Troponin negative. White blood cell count of 0.5, H and H 9.9 and 30.8, platelet count of 216.  Urinalysis 1+ ketones, otherwise negative.   Chest x-ray: Widening of the descending thoracic aorta similar to prior. Cardiac silhouette enlarged. CT scan of the head from yesterday negative.   ASSESSMENT AND PLAN: 1. Encephalopathy: With the patient's history of cancer, could be numerous things. I will get an MRI of the brain to rule out metastases.  With her white count being low, could be infection. We will get blood cultures x 2 and empiric antibiotics, Maxipime and vancomycin.  MRI would also rule out stroke.  2. Leukopenia, most likely chemotherapy-related:  We will consult Dr. Orlie Dakin, and we will put on neutropenic precautions.  3. Peritoneal carcinomatosis:   Overall prognosis is poor. The patient is having severe abdominal pain now. CT scan of the abdomen and pelvis ordered for restaging.  I will get Tami [name] palliative care consultation. The patient is not eating. I will give p.r.n. pain medications, nausea medications, and IV Protonix.  4. Deep vein thrombosis: ER physician did not order an INR.  I am still waiting for that result to come back. Coumadin dosing will be based on that. The patient did have an IVC filter.  5. Erythema on the neck: I am wondering if this patient has dermatomyositis. I will send off Tami [name] CPK.  6. Hypertension: I will hold blood pressure medications at this time.  7. Hyperlipidemia: I will hold cholesterol medication at this time.   CODE STATUS:  The case discussed with Tami [name] nephew, who had to leave early, would like the patient to be Tami [name].    TIME SPENT ON ADMISSION: 55  minutes.  ____________________________ Tami Tami [name]. Tami Gloss, MD rjw:cb D: 10/12/2012 21:41:12 ET T: 10/12/2012 21:57:36 ET JOB#: [id number]  cc: Tami Tami [name]. Tami Gloss, MD, <Dictator> Tollie Pizza. Tami Dakin, MD Salley Scarlet MD ELECTRONICALLY SIGNED 10/16/2012 19:21

## 2014-10-27 NOTE — Discharge Summary (Signed)
PATIENT NAME:  Tami Tami [name], Tami Tami [name] MR#:  [id number] DATE OF BIRTH:  July 11, 1945  DATE OF ADMISSION:  10/12/2012 DATE OF DISCHARGE:  10/18/2012  PRIMARY CARE PHYSICIAN: Dr. Orlie DakinFinnegan.   DISCHARGE DIAGNOSES: 1.  Peritoneal carcinomatosis.  2.  Neutropenia.  3.  Pancytopenia.  4.  Acute encephalopathy.  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  6.  Chronic dissection of aorta.  7.  History of deep vein thrombosis.  8.  Hypokalemia, hypomagnesemia.  9.  Malnutrition.   CONSULTANTS: Dr. Harvie JuniorPhifer of palliative care, Dr. Orlie DakinFinnegan of oncology, Dr. Toni Amendlapacs of psychiatry.   IMAGING STUDIES DONE: Include Tami [name] CT scan of the abdomen and pelvis with contrast shows large amount of abdominal and pelvic fluid versus peritoneal carcinomatosis. Also Tami [name] chronic, stable aortic dissection in the abdominal aorta.   ADMITTING HISTORY AND PHYSICAL AND HOSPITAL COURSE: Please see detailed H and P dictated on 10/12/2012. In brief, this is Tami [name] patient with peritoneal carcinomatosis who was brought into the hospital with progressively worsening encephalopathy. The patient was admitted for further workup and treatment.   HOSPITAL COURSE:  Acute encephalopathy. This was thought to be likely secondary from her advanced cancer versus paraneoplastic syndrome. The patient was seen by Dr. Orlie DakinFinnegan of oncology, later by Dr. Sherrlyn HockPandit. The patient, with Tami [name] poor prognosis and no response to chemotherapy, was thought to be Tami [name] candidate for hospice service. The case was discussed with family by oncology and palliative care being involved. The family decided to make the patient comfort care and transition to hospice home. The patient did have neutropenia for which she had Neulasta, was covered with broad-spectrum antibiotics of cefepime and vancomycin. Later vancomycin was discontinued and cefepime continued. The patient's neutropenia had resolved during the hospital stay. CT of the abdomen showed peritoneal carcinomatosis  and Tami [name] chronic stable aortic dissection. Dr. Wyn Quakerew was involved in her care, who suggested just followup of the dissection, no acute intervention.   On the day of discharge, the patient still was confused with blood pressure 120/87 and was discharged to hospice home with poor prognosis.   DISCHARGE MEDICATIONS: 1.  Fentanyl patch 25 mcg topical every 3 days.  2.  Lorazepam 0.5 mg, 1 to 2 tablets sublingual every 2 to 4 hours as needed for agitation and anxiety.   3.  Morphine 20 mg/mL, 0.5 to 1 mL every 1 to 2 hours as needed for pain, dysphagia.  4.  Zofran ODT 4 mg oral every 6 hours as needed for nausea, vomiting.   DISCHARGE INSTRUCTIONS: Further management as per hospice home. The patient's family decided on comfort measures.   TIME SPENT: On day of discharge in discharge activity was 38 minutes.   ____________________________ Tami BailiffSrikar R. Antonae Zbikowski, MD srs:cs D: 10/19/2012 14:53:00 ET T: 10/19/2012 15:09:49 ET JOB#: [id number]  cc: Tami HeathSrikar R. Nika Yazzie, MD, <Dictator> Tami FishermanSRIKAR R Ivoree Felmlee MD ELECTRONICALLY SIGNED 10/21/2012 13:00

## 2014-10-27 NOTE — Consult Note (Signed)
PATIENT NAME:  Tami [name], Tami [name] MR#:  [id number] DATE OF BIRTH:  April 11, 1946  DATE OF CONSULTATION:  10/13/2012  REFERRING PHYSICIAN:   CONSULTING PHYSICIAN:  Knute Neu. Gittin, MD  HISTORY OF PRESENT ILLNESS:  The patient is a 69 year old patient who is primarily [facility].  The patient has peritoneal carcinomatosis and has received Taxol and carboplatin therapy.  The last cycle, day one cycle 3 was 1 week ago.  The patient was planned for restaging scanning and GYN oncology evaluation after the third cycle.    PAST MEDICAL HISTORY:  The patient has a past history that includes abdominal aortic aneurysm with dissection, DVT on Coumadin, anemia, prior hysterectomy, IVC filter placement, Port-A-Cath placement.   ALLERGIES:  No known allergies.   MEDICATIONS:  Have been amlodipine 2.5 mg daily, atorvastatin 20 mg daily, Coumadin 5 mg daily, meloxicam daily, Reglan 4 times daily, Zofran 8 mg 3 times daily as needed, potassium 20 mEq twice a day.   SOCIAL HISTORY:  No alcohol.  No smoking.   FAMILY HISTORY:  Noncontributory.  There is lung cancer in the family.   The patient was admitted April 8th with altered mental status, general weakness, the patient is not taking medications or eating.  There was no history of fever previously, but she did show a temperature during this hospitalization.  Also, severe abdominal pain and was admitted with these symptoms, evaluation particularly directed towards the abdominal pain, neutropenia, watching for occult infection and altered mental status.   REVIEW OF SYSTEMS:   Additional system review at this time:  The patient says she has abdominal pain.  Denied headache or other pain.  Denied short of breath or cough.  Admitted that she was weak.  Said she felt cold, was not having a chill, was not coughing.  Said she was not hungry.  No pruritus.  No vomiting or diarrhea.   PHYSICAL EXAMINATION: GENERAL:  Alert, answers  questions, but mentally sluggish.  No gross focal weakness, moving all extremities against gravity.  Following one-step commands.  HEENT:  Sclerae clear.  No thrush.  LYMPHATIC:  No palpable mass or nodes in the neck, supraclavicular, submandibular, axilla.  ABDOMEN:  Slightly tender, but no guarding and no rigidity.  HEART:  Non-regular.  LUNGS:  Clear.  EXTREMITIES:  No lower extremity edema.   LABORATORY DATA:  Creatinine on admission was 0.6 with a potassium was 3.3, the glucose was 87, calcium was 7.1.  Sodium was 140, white count was 300, hemoglobin 7.6, platelets were 184.   IMPRESSION:  The patient with abdominal pain, has been given morphine.  She is given vancomycin and cefepime apparently for neutropenia, altered mental status and then later she had a temperature of 100.3.  Temperature is down now.  MRI was planned, but not yet performed.  Abdominal scan was done that shows what looks like persistent findings with no real improvement compared to the pretreatment scan.   PLAN:  Agree with plans for MRI.  Agree with broad-spectrum empiric antibiotics, neutropenic fever.  I ordered a dose of Neupogen and we should reassess that every 24 hours depending on the neutrophil count.  Appreciated palliative care consult.  Oncology will follow up tomorrow.  Dr. 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  Would transfuse at current levels of hemoglobin less than 8 grams .  The patient appears to be symptomatic with weakness and tachycardia.  Use irradiated products when transfusing.  Appropriate actions depending on the MRI findings.     ____________________________ Knute Neuobert G. Lorre NickGittin, MD rgg:ea D: 10/13/2012 20:13:07 ET T: 10/13/2012 23:18:33  ET JOB#: [id number]  cc: Knute Neuobert G. Lorre NickGittin, MD, <Dictator> Marin RobertsOBERT G GITTIN MD ELECTRONICALLY SIGNED 10/28/2012 9:53

## 2014-10-27 NOTE — Op Note (Signed)
PATIENT NAME:  Tami [name], Tami [name] MR#:  [id number] DATE OF BIRTH:  08/28/1945  DATE OF PROCEDURE:  08/10/2012  PREOPERATIVE DIAGNOSIS: Peritoneal carcinomatosis.   POSTOPERATIVE DIAGNOSIS: Advanced peritoneal carcinomatosis.   PROCEDURE PERFORMED: Mini laparotomy with biopsy of an omental cake.   SURGEON: Maxine GlennBrigitte E. Ely Ballen, MD   ANESTHESIA: General.   ESTIMATED BLOOD LOSS: 25 mL.   COMPLICATIONS: None.   INDICATION FOR SURGERY: The patient is [name] 69 year old patient who presented with advanced peritoneal carcinomatosis and in view of this is not [name] candidate for tumor reductive surgery. However, [name] CT-directed biopsy was not possible; therefore, decision was made to proceed with mini laparotomy.   FINDINGS AT THE TIME OF SURGERY: Large omental cake with dense scarring to the stomach and the transverse colon. The small bowel mesentery is retracted due to tumor, and multiple nodular lesions are seen on large and small bowel and abdominal wall. In view of the mesenteric disease, tumor reductive surgery is not possible. Due to adhesion, it was not possible to completely evaluate the pelvis. Ascites, 2000 mL, was removed.   OPERATIVE REPORT: After adequate general anesthesia had been obtained, the patient was prepped and draped in supine position. [name] small incision was placed above the umbilicus. The fascia was transected. The peritoneum was identified and entered. The ascites was removed. Then, using cautery, biopsies were taken from the omental cake. Hemostasis was achieved with cautery and noted to be adequate.   The fascia was closed with [name] running #1 PDS suture. Adequate hemostasis in the subcutaneous tissue was obtained before it was reapproximated with 2-0 Vicryl, and the skin was closed with [name] 3-0 Monocryl suture in [name] subcuticular fashion.  The patient tolerated the procedure well and was taken to the recovery room in stable condition. Postoperative urine was clear. Pad, sponge, needle, and  instrument counts were correct x 2.   ____________________________ Maxine GlennBrigitte E. Demari Gales, MD bem:cb D: 08/11/2012 10:51:02 ET T: 08/11/2012 11:15:47 ET JOB#: [id number]  cc: Maxine GlennBrigitte E. Chanique Duca, MD, <Dictator> Maxine GlennBRIGITTE E Indonesia Mckeough MD ELECTRONICALLY SIGNED 08/17/2012 14:23

## 2014-10-27 NOTE — Consult Note (Signed)
Patient being admitted with abdominal pain, FTT.  CT scan shows severe ascites and likely carcinomatosis of unknown source.  Also has about a 5 cm thoracoabdominal AAA secondary to old dissection.  This has only grown about 5 mm in 2-3 years. This has a low risk of rupture on the range of about 1%/year.  Below threshold size for consideration for intervention, and given her likely late stage malignancy, repair would not be considered at this time.  Will see as an outpatient if desired.    Electronic Signatures: Annice Needyew, Tametra Ahart S (MD)  (Signed on 18-Jan-14 21:48)  Authored  Last Updated: 18-Jan-14 21:48 by Annice Needyew, Nature Kueker S (MD)

## 2014-10-27 NOTE — Consult Note (Signed)
Details:   - Psychiatry: Patient seen. Still confused and intermittantly agitated. Clearly in pain. Patient not able to give history - information from nurses on duty. I will change the risperdal to M-Tab (dissolvible) . Cont pain management and prn haldol and will check in tomorrow. Family should be aware that pt may not return to full lucidity given her advanced illness and should be made comfortable.   Electronic Signatures: Audery Amellapacs, John T (MD)  (Signed 12-Apr-14 11:18)  Authored: Details   Last Updated: 12-Apr-14 11:18 by Audery Amellapacs, John T (MD)

## 2014-10-27 NOTE — Consult Note (Signed)
History of Present Illness:   Reason for Consult Possible peritoneal carcinomatosis.    HPI   Patient is a 69 year old female who presented to the emergency room with an approximately one month history of abdominal pain and swelling, nausea, poor PO intake and an undetermined amount of weight loss.  CT scan of the abdomen and pelvis was suggestive of ascites with peritoneal carcinomatosis.  She has no neurologic complaints.  She denies any recent fevers.  She denies any chest pain or shortness of breath.  She denies any constipation or diarrhea.  She has no urinary complaints.  Patient states she feels generally "terrible", but offers no further specific complaints.  PFSH:   Additional Past Medical and Surgical History Past medical history: Hypertension, CAD.  Past surgical history: Total hysterectomy.  Family history: Negative and noncontributory.  Social history: Heavy tobacco use, denies alcohol.   Review of Systems:   Performance Status (ECOG) 0    Review of Systems   As per HPI. Otherwise, 10 point system review was negative.   NURSING NOTES: **Vital Signs.:   19-Jan-14 05:25    Vital Signs Type: Routine    Temperature Temperature (F): 97.8    Celsius: 36.5    Temperature Source: Oral    Pulse Pulse: 76    Respirations Respirations: 20    Systolic BP Systolic BP: 154    Diastolic BP (mmHg) Diastolic BP (mmHg): 63    Mean BP: 80    Pulse Ox % Pulse Ox %: 99    Pulse Ox Activity Level: At rest    Oxygen Delivery: Room Air/ 21 %   Physical Exam:   Physical Exam General: Thin, no acute distress. Eyes: Pink conjunctiva, anicteric sclera. HEENT: Normocephalic, moist mucous membranes, clear oropharnyx. Lungs: Clear to auscultation bilaterally. Heart: Regular rate and rhythm. No rubs, murmurs, or gallops. Abdomen: Soft,  mildly tender and distended. Normoactive bowel sounds. Musculoskeletal: No edema, cyanosis, or clubbing. Neuro: Alert, answering all  questions appropriately. Cranial nerves grossly intact. Skin: No rashes or petechiae noted. Psych: Normal affect.    No Known Allergies:     amlodipine 2.5 mg oral tablet: 1 tab(s) orally once a day, Active, 0, None   metoprolol tartrate 100 mg oral tablet: 1 tab(s) orally 3 times a day, Active, 0, None   atorvastatin 20 mg oral tablet: 1 tab(s) orally once a day (at bedtime), Active, 0, None   clonazepam 0.5 mg oral tablet: 1 tab(s) orally 3 times a day, Active, 0, None   lisinopril 20 mg oral tablet: 1 tab(s) orally once a day, Active, 0, None   fluoxetine 20 mg oral capsule: 1 cap(s) orally once a day, Active, 0, None   hydrochlorothiazide 12.5 mg oral tablet: 1 tab(s) orally once a day, Active, 0, None   Norvasc 10 mg oral tablet: 1 tab(s) orally once a day, Active, 0, None   meloxicam 15 mg oral tablet: 1 tab(s) orally once a day, Active, 0, None  Laboratory Results: Routine Chem:  19-Jan-14 06:44    Glucose, Serum 77   BUN  19   Creatinine (comp) 0.90   Sodium, Serum 144   Potassium, Serum 4.0   Chloride, Serum 107   CO2, Serum 27   Calcium (Total), Serum 8.9   Anion Gap 10   Osmolality (calc) 288   eGFR (African American) >60   eGFR (Non-African American) >60 (eGFR values <50m/min/1.73 m2 may be an indication of chronic kidney disease (CKD). Calculated eGFR is useful in  patients with stable renal function. The eGFR calculation will not be reliable in acutely ill patients when serum creatinine is changing rapidly. It is not useful in  patients on dialysis. The eGFR calculation may not be applicable to patients at the low and high extremes of body sizes, pregnant women, and vegetarians.)  Routine Hem:  19-Jan-14 06:44    WBC (CBC) 6.1   RBC (CBC) 4.15   Hemoglobin (CBC)  11.3   Hematocrit (CBC) 35.2   Platelet Count (CBC) 234   MCV 85   MCH 27.2   MCHC 32.2   RDW  15.2   Neutrophil % 76.9   Lymphocyte % 11.6   Monocyte % 10.0   Eosinophil % 0.4    Basophil % 1.1   Neutrophil # 4.7   Lymphocyte #  0.7   Monocyte # 0.6   Eosinophil # 0.0   Basophil # 0.1 (Result(s) reported on 25 Jul 2012 at 07:24AM.)   Assessment and Plan:  Impression:   Peritoneal carcinomatosis.  Plan:   1. Possible peritoneal carcinomatosis with ascites:  Patient has an ultrasound guided paracentesis scheduled.  Please send fluid for cytology with the hopes of obtaining a diagnosis.  If ascitic fluid is unhelpful, she likely will require a surgical biopsy.  Will discuss this further if necessary with Dr. Claiborne Rigg on Tuesday.  CA 125 is pending Nausea: Will add Reglan 4 times per day.  consult, will follow.  Electronic Signatures: Delight Hoh (MD)  (Signed 19-Jan-14 11:00)  Authored: HISTORY OF PRESENT ILLNESS, PFSH, ROS, NURSING NOTES, PE, ALLERGIES, HOME MEDICATIONS, LABS, ASSESSMENT AND PLAN   Last Updated: 19-Jan-14 11:00 by Delight Hoh (MD)

## 2014-10-27 NOTE — Consult Note (Signed)
Brief Consult Note: Diagnosis: delirium.   Patient was seen by consultant.   Consult note dictated.   Orders entered.   Comments: Psychiatry: Patient seen. Patient with carcinomatosis with subacute delirium. No identified past psych history. Patient awake but minimally interactive. Granddaughter at bedside reports patient continues to appear to halucinate and appear confused. MRI neg Patient very sick. Multiple lab abnormalities including nearly non-existant WBC that could lead to atypical infections. Many reasons to be delirious.  PRN haldol q6 in place. Will add 0.5mg  risperdal bid. As I told the granddaghter, we can try to help her be comfortable and less agitated but don't want to cause unnecessary side effects.  Electronic Signatures: Audery Amellapacs, Jaxden Blyden T (MD)  (Signed 11-Apr-14 20:39)  Authored: Brief Consult Note   Last Updated: 11-Apr-14 20:39 by Audery Amellapacs, Jeremiyah Cullens T (MD)

## 2014-10-27 NOTE — Op Note (Signed)
PATIENT NAME:  Tami [name], Jasenia A MR#:  [id number] DATE OF BIRTH:  [birth year]  DATE OF PROCEDURE:  08/10/2012  PREOPERATIVE DIAGNOSES: 1.  Ovarian cancer.  2.  Deep venous thrombosis.   POSTOPERATIVE DIAGNOSES:  1.  Ovarian cancer.  2.  Deep venous thrombosis.  PROCEDURE PERFORMED:   1.  Insertion of infrarenal inferior vena caval filter, Meridian type, right jugular approach.  2.  Insertion of right Infuse-a-Port, right jugular approach.    PROCEDURE PERFORMED BY:  Renford DillsGregory G. Yanis Larin, M.D.   SEDATION:  Versed 4 mg plus fentanyl 150 mcg administered IV.  Continuous ECG, pulse oximetry and cardiopulmonary monitoring was performed throughout the entire procedure by the interventional radiology nurse.  Total sedation time was 50 minutes.   ACCESS:  An 8 French sheath, right IJ.   FLUOROSCOPY TIME:  2.6 minutes.   CONTRAST USED:  Isovue 15 mL.   INDICATIONS:  The patient is a 69 year old woman who is recently found to have ovarian cancer.  She is undergoing staging laparoscopy this afternoon and in preparation for chemotherapy an IVC port has been requested.  Subsequently in discussion with Dr. Orlie DakinFinnegan yesterday afternoon, ultrasound of her leg for new onset swelling revealed DVT and she is therefore undergoing an IVC filter placement since she will be off her anticoagulation for the surgery.  The risks and benefits were reviewed.  All questions answered.  The patient has agreed to proceed.   DESCRIPTION OF PROCEDURE:  The patient is taken to special procedures, placed in the supine position.  After adequate sedation is achieved, the right neck and chest wall are prepped and draped in a sterile fashion.  Then 1% lidocaine is infiltrated in the soft tissues of the chest wall 2 fingerbreadths below the clavicle as well as at the base of the neck.  Ultrasound is placed in a sterile sleeve.  Ultrasound is utilized secondary to lack of appropriate landmarks and to avoid vascular injury.  Under  direct ultrasound visualization, the jugular vein is identified.  It is echolucent, homogeneous and easily compressible indicating patency.  Images are recorded for the permanent record. Under real-time visualization, Seldinger needle is inserted. J-wire is then advanced and negotiated into the inferior vena cava with the assistance of a Kumpe catheter.  Counterincision is created and a small pocket dissected bluntly with a hemostat.  The sheath is then advanced down the confluence of the iliac veins and AP projection of the inferior vena cava is obtained.  After measurements are taken and the renal veins localized, a Meridian filter is deployed from the jugular approach.  This was after placing the delivery sheath over the wire and down to the proper location, which was utilized for the IVC gram.  The filter was deployed quite easily and in an excellent orientation.  The sheath is then removed over a wire and the 7-French peel-away sheath for the Infuse-a-Port is advanced over the wire.  Wire is then removed and the catheter is then tunneled after a pocket is created on the chest wall.  A pocket is created using both blunt and sharp dissection.  Catheter is tunneled from the pocket to the neck counterincision using the provided tunneling device.  The catheter is then advanced into the central venous system and the peel-away sheath removed under fluoroscopy.  The catheter is positioned with its tip in the mid atrium.  It is then transected, connected to the port, and the port is slipped into the pocket without difficulty.  Grasped the  skin edge with Adson's forceps, percutaneous access of the port is obtained with a straight Huber needle; and subsequently, the port aspirates and flushes quite well and is flushed with 20 mL of heparinized saline.  The pocket is then closed in layers using 3-0 Vicryl in an interrupted fashion and subsequently 4-0 Monocryl subcuticular for the skin.  Counterincision is closed with 4-0  Monocryl subcuticular.  Dermabond is applied to both incisions.  Angled Huber needle is then used to access the port directly.  It once again aspirates easily.  It is flushed with 20 mL of heparinized saline, and then the sterile dressing is applied.  The patient will be transported to the operating room where Dr. Hyacinth Meeker will perform her laparoscopic staging operation.     ____________________________ Renford Dills, MD ggs:ea D: 08/10/2012 16:21:59 ET T: 08/11/2012 00:12:05 ET JOB#: [id number]  cc: Renford Dills, MD Maxine Glenn, MD Tollie Pizza. Orlie Dakin, MD Renford Dills MD ELECTRONICALLY SIGNED 08/23/2012 23:26

## 2014-10-27 NOTE — Consult Note (Signed)
Brief Consult Note: Diagnosis: PERITONEAL CARCINIMAOTOSIS, FEBRILE NEUTROPENIA, ANEMIA, ALTERED MENTAL STATUS.   Patient was seen by consultant.   Orders entered.   Comments: SEE DICTATED NOTE. PATIENT S/P 3 CYCLES TX, WITH SLOWLY PROGRESSSING, NOT IMPROVING, TUMOR MARKERS.  AGREE WITH ANTIBIOTICS AS NEUTROPENIC AND HAD TEMP TO 100.3. NO FOCAL NEURO DEFICIT BUT ALTERED MENTAL STATUS, AGREE WITH MRI. AS TACHYCARDIC AND WEAK, AND CHEMOTX PATIENT, WOULD TRANSFUSE ONE UNIT IRRADIATED PRBC AT CURRENT HGB LEVELS. I HAVE ORDERED A DOSE OF NEUPOGEN STAT, WOULD REEVALUATE DAILY.  Electronic Signatures: Marin RobertsGittin, Robert G (MD)  (Signed 09-Apr-14 20:03)  Authored: Brief Consult Note   Last Updated: 09-Apr-14 20:03 by Marin RobertsGittin, Robert G (MD)

## 2014-10-27 NOTE — Consult Note (Signed)
ONCOLOGY followup - resting in bed, weak, lethargic. Per d/w her brother over the phone, she was indicating abdominal pain earlier today.  No fever.  No obvious bleeding issues. weak and lethargic, otherwise appears comfortable           vitals - 97.4, 112, 19, 111/72, 91% on RA          lungs - b/l good breath sounds, no rhonchi          abd - distended, firm, generalised mild tenderness          extremities - edema + hemoglobin 7.4, WBC 2700, neutrophils 49%, bands 22%, MM 1%, metas 1%, Mag 1.5, K+ 3.742, INR 433.555. year old female with known Peritoneal carcinomatosis s/p cycle 3 chemo on 10/06/12 with Taxol/Carboplatin, admitted with febrile neutropenia, AMS. MRI brain negative for metastasis or other acute process. Remains afebrile, ANC has normalised, will stop Neupogen. Left shift likely from infection and neupogen effect. AMS is not improving and she is more lethargic, per d/w her brother she remains confused. Have explained to him that her malignancy is incurable stage disease, that chemotherapy was palliative only, and that ongoing declining condition could be from terminal condition with confusion/delirium versus metabolic encephalopathy. Also explained that overall prognosis seems very poor. He is concerned about pain issues, otherwise is agreeable to ongoing treatment to see if she improves, and mainly wants her to be comfortable. Will change MSO4 to 2-4 mg IV q 1-2 hours prn pain. She is DNR. Continue to monitor daily CBC. Will continue to follow.  Electronic Signatures: Izola PricePandit, Shalice Woodring Raj (MD)  (Signed on 12-Apr-14 22:44)  Authored  Last Updated: 12-Apr-14 22:44 by Izola PricePandit, Conan Mcmanaway Raj (MD)

## 2014-10-27 NOTE — Discharge Summary (Signed)
PATIENT NAME:  Tami [name], Tami [name] MR#:  [id number] DATE OF BIRTH:  [birth year]  DATE OF ADMISSION:  07/24/2012 DATE OF DISCHARGE:  07/26/2012  PRESENTING COMPLAINT: Nausea and abdominal pain.   DISCHARGE DIAGNOSES:  1. Peritoneal carcinomatosis.  2. Ascites, status post paracentesis, cytology pending.  3. Hypertension.  4. Nausea.  5. Weight loss.  6. 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  7. Urinary tract infection.   CONDITION ON DISCHARGE: Fair.   CODE STATUS: The patient full code.   MEDICATIONS:  1. Atorvastatin 20 mg at bedtime.  2. Clonazepam 0.5 mg 3 times [name] day.  3. Fluoxetine 20 mg daily.  4. Meloxicam 15 mg daily.  5. Amlodipine 2.5 mg daily.  6. Ensure Plus daily.  7. Cipro 250 b.i.d.  8. Promethazine 25 mg every 6 hours as needed.   FOLLOWUP:  1. [facility] in 1 to 2 weeks.  2. Follow up with Dr. Orlie [name] on January 21 at 3:30 p.m.   CONSULTATION: Dr. Orlie [name].   PROCEDURE: Abdominal paracentesis.   LABS AT DISCHARGE: Ascitic fluid protein 6.1, albumin 3.3. Ascitic fluid amylase is 165. Cytology: Reactive mesothelial cells present. Body fluid culture: No growth. White count is 6.1, H and H 11.3 and 35.2. Basic metabolic panel within normal limits. CA-125 is 1035. CEA is 0.4. CT of the abdomen and pelvis showed moderate degree of ascites. There is nodular thickening of the peritoneal reflections as well as nodular thickening of the mesenteric fat of the upper abdomen, concerning for peritoneal carcinomatosis. Descending thoracic aortic aneurysm as well as thoracic and abdominal aortic dissection are similar to prior. UA positive for UTI. Urine culture is negative.   BRIEF SUMMARY OF HOSPITAL COURSE: The patient is [name] who presented to the Emergency Room with abdominal pain, nausea and vomiting. She was admitted with:   1. Peritoneal carcinomatosis, source unknown. However, given elevated CA-125, could  likely be ovarian. The patient reports undergoing total hysterectomy, details unavailable. She is to [facility]ig[facility]. Orlie [name]. Dr. Orlie [name] from Cancer Center was consulted.  2. Nausea. Supportive treatment. Recommended Ensure drinks, p.r.n. Phenergan was prescribed.  3. Malnutrition, weight loss, suspected from possible underlying malignancy. Workup to be done as outpatient.  4. Ascites, status post paracentesis. Cytology results were pending at discharge. She will be following up with Dr. Orlie [name] as outpatient.  5. Chronic distal aortic dissection. The patient's blood pressures were well controlled. She will be discharged on amlodipine 2.5 mg orally. Blood pressure stayed [name] little on the lower side. The rest of her BP meds were discontinued.  6. Hyperlipidemia. Lipitor was continued.  7. Hospital stay otherwise remained stable.   CODE STATUS: The patient remained [name] full code.   TIME SPENT: 40 minutes.    ____________________________ Tami [name]. Allena KatzPatel, MD sap:OSi D: 08/05/2012 07:18:00 ET T: 08/05/2012 09:44:57 ET JOB#: [id number]  cc: Tami [name]. Allena KatzPatel, MD, <Dictator> [facility] Tami J. Orlie DakinFinnegan, MD Tami [name] Tami Wirsing MD ELECTRONICALLY SIGNED 08/13/2012 7:02

## 2014-10-27 NOTE — Consult Note (Signed)
Chief Complaint:  Subjective/Chief Complaint Has some abdominal pain, denies headache or sob   VITAL SIGNS/ANCILLARY NOTES: **Vital Signs.:   10-Apr-14 18:23  Vital Signs Type Q 4hr  Temperature Temperature (F) 98.1  Celsius 36.7  Temperature Source oral  Pulse Pulse 128  Respirations Respirations 18  Systolic BP Systolic BP 126  Diastolic BP (mmHg) Diastolic BP (mmHg) 84  Mean BP 98  Pulse Ox % Pulse Ox % 92  Pulse Ox Activity Level  At rest  Oxygen Delivery Room Air/ 21 %   Brief Assessment:  Cardiac Regular   Respiratory normal resp effort   Gastrointestinal details normal No gaurding  mild tender   Additional Physical Exam alert, lethargic but more alert than yesterday, no gross focal weakness   Lab Results: Routine Chem:  10-Apr-14 04:50   Result Comment WBC - CRITICAL VALUE PREVIOUSLY NOTIFIED.  - RESULTS VERIFIED BY REPEAT TESTING.  - DUE TO THE LOW WBC, THE INSTRUMENT DIFF  - CANNOT BE CONFIRMED BY MANUAL DIFF AND  - IS REPORTED PRIMARILY TO IDENTIFY CELL  - TYPES PRESENT.  Result(s) reported on 14 Oct 2012 at 06:10AM.  Routine Hem:  10-Apr-14 04:50   WBC (CBC)  0.6  RBC (CBC)  2.54  Hemoglobin (CBC)  7.3  Hematocrit (CBC)  22.0  Platelet Count (CBC) 169  MCV 87  MCH 28.6  MCHC 33.1  RDW  23.2  Neutrophil % 13.5  Lymphocyte % 55.6  Monocyte % 26.7  Eosinophil % 3.6  Basophil % 0.6  Neutrophil #  0.1  Lymphocyte #  0.3  Monocyte # 0.2  Eosinophil # 0.0  Basophil # 0.0   Assessment/Plan:  Assessment/Plan:  Assessment PERITONEAL CARCINOMATOSIS   , ANEMIA AND LEUKOPENIA, PLTS OK. NO FEVER TODAY, ON ANTIBIOTICS. POLYS STILL LOW,  ON NEUPOGEN. MRI BRAIN NEGATIVE, . AS PER FAMILY,  CONCENTRATION, PERSONALITY, DETERIORATING OVER MONTHS .PATIENT MINIMALLY CONVERSANT. IN NO OBVIOUS DISTRESS   Plan CONTINUING ANTIBIOTICS.  NEUPOGEN DOSE TONIGHT, REASSESS DAILY AS LONG AS NEUTROPENIC. SUGGEST TRANSFUSE IRRADIIATED PRBC 1 UNIT FOR WEAKNESS, MAY ALSO HELP  MENTAL CLARITY. DISCUSSED MENTAL STATUS WITH FAMILY MEMBER. WILL ALSO CONSULT PSYCHIATRY FOR PSYCHOSIS, POSSIBLE AGGITATED DEPRESSION   Electronic Signatures: Marin RobertsGittin, Robert G (MD)  (Signed 10-Apr-14 19:00)  Authored: Chief Complaint, VITAL SIGNS/ANCILLARY NOTES, Brief Assessment, Lab Results, Assessment/Plan   Last Updated: 10-Apr-14 19:00 by Marin RobertsGittin, Robert G (MD)
